# Patient Record
Sex: Female | Born: 1935 | Race: White | Hispanic: No | Smoking: Never smoker
Health system: Southern US, Community
[De-identification: ages and names within clinical notes are randomized; demographics above are authoritative.]

## PROBLEM LIST (undated history)

## (undated) DIAGNOSIS — D649 Anemia, unspecified: Secondary | ICD-10-CM

## (undated) DIAGNOSIS — N183 Chronic kidney disease, stage 3 unspecified: Secondary | ICD-10-CM

## (undated) DIAGNOSIS — K5790 Diverticulosis of intestine, part unspecified, without perforation or abscess without bleeding: Secondary | ICD-10-CM

## (undated) DIAGNOSIS — M1990 Unspecified osteoarthritis, unspecified site: Secondary | ICD-10-CM

## (undated) DIAGNOSIS — R600 Localized edema: Secondary | ICD-10-CM

## (undated) DIAGNOSIS — E785 Hyperlipidemia, unspecified: Secondary | ICD-10-CM

## (undated) DIAGNOSIS — IMO0001 Reserved for inherently not codable concepts without codable children: Secondary | ICD-10-CM

## (undated) DIAGNOSIS — D126 Benign neoplasm of colon, unspecified: Secondary | ICD-10-CM

## (undated) DIAGNOSIS — N6320 Unspecified lump in the left breast, unspecified quadrant: Secondary | ICD-10-CM

## (undated) DIAGNOSIS — I471 Supraventricular tachycardia, unspecified: Secondary | ICD-10-CM

## (undated) DIAGNOSIS — I700 Atherosclerosis of aorta: Secondary | ICD-10-CM

## (undated) DIAGNOSIS — H01009 Unspecified blepharitis unspecified eye, unspecified eyelid: Secondary | ICD-10-CM

## (undated) DIAGNOSIS — R002 Palpitations: Secondary | ICD-10-CM

## (undated) DIAGNOSIS — E669 Obesity, unspecified: Secondary | ICD-10-CM

## (undated) DIAGNOSIS — E7800 Pure hypercholesterolemia, unspecified: Secondary | ICD-10-CM

## (undated) DIAGNOSIS — H547 Unspecified visual loss: Secondary | ICD-10-CM

## (undated) DIAGNOSIS — R7303 Prediabetes: Secondary | ICD-10-CM

## (undated) DIAGNOSIS — M1711 Unilateral primary osteoarthritis, right knee: Secondary | ICD-10-CM

## (undated) DIAGNOSIS — I10 Essential (primary) hypertension: Secondary | ICD-10-CM

## (undated) HISTORY — DX: Unilateral primary osteoarthritis, right knee: M17.11

## (undated) HISTORY — DX: Diverticulosis of intestine, part unspecified, without perforation or abscess without bleeding: K57.90

## (undated) HISTORY — DX: Atherosclerosis of aorta: I70.0

## (undated) HISTORY — DX: Benign neoplasm of colon, unspecified: D12.6

## (undated) HISTORY — DX: Unspecified lump in the left breast, unspecified quadrant: N63.20

## (undated) HISTORY — PX: ROTATOR CUFF REPAIR: SHX139

## (undated) HISTORY — PX: CHOLECYSTECTOMY: SHX55

## (undated) HISTORY — DX: Unspecified blepharitis unspecified eye, unspecified eyelid: H01.009

## (undated) HISTORY — DX: Anemia, unspecified: D64.9

## (undated) HISTORY — DX: Unspecified visual loss: H54.7

## (undated) HISTORY — DX: Pure hypercholesterolemia, unspecified: E78.00

## (undated) HISTORY — DX: Reserved for inherently not codable concepts without codable children: IMO0001

## (undated) HISTORY — DX: Supraventricular tachycardia: I47.1

## (undated) HISTORY — DX: Hyperlipidemia, unspecified: E78.5

## (undated) HISTORY — PX: ABDOMINAL HYSTERECTOMY: SHX81

## (undated) HISTORY — DX: Chronic kidney disease, stage 3 (moderate): N18.3

## (undated) HISTORY — DX: Unspecified osteoarthritis, unspecified site: M19.90

## (undated) HISTORY — DX: Essential (primary) hypertension: I10

## (undated) HISTORY — DX: Chronic kidney disease, stage 3 unspecified: N18.30

## (undated) HISTORY — DX: Localized edema: R60.0

## (undated) HISTORY — DX: Palpitations: R00.2

## (undated) HISTORY — DX: Supraventricular tachycardia, unspecified: I47.10

## (undated) HISTORY — DX: Obesity, unspecified: E66.9

## (undated) HISTORY — DX: Prediabetes: R73.03

---
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
## 1997-09-06 ENCOUNTER — Ambulatory Visit (HOSPITAL_COMMUNITY): Admission: RE | Admit: 1997-09-06 | Discharge: 1997-09-06 | Payer: Self-pay | Admitting: Surgery

## 1999-04-29 HISTORY — PX: ROTATOR CUFF REPAIR: SHX139

## 1999-10-03 ENCOUNTER — Encounter: Admission: RE | Admit: 1999-10-03 | Discharge: 1999-10-03 | Payer: Self-pay | Admitting: Family Medicine

## 1999-10-03 ENCOUNTER — Encounter: Payer: Self-pay | Admitting: Family Medicine

## 1999-12-19 ENCOUNTER — Encounter: Payer: Self-pay | Admitting: Orthopedic Surgery

## 1999-12-19 ENCOUNTER — Encounter: Admission: RE | Admit: 1999-12-19 | Discharge: 1999-12-19 | Payer: Self-pay | Admitting: Orthopedic Surgery

## 1999-12-20 ENCOUNTER — Ambulatory Visit (HOSPITAL_BASED_OUTPATIENT_CLINIC_OR_DEPARTMENT_OTHER): Admission: RE | Admit: 1999-12-20 | Discharge: 1999-12-21 | Payer: Self-pay | Admitting: Orthopedic Surgery

## 2000-02-22 ENCOUNTER — Emergency Department (HOSPITAL_COMMUNITY): Admission: EM | Admit: 2000-02-22 | Discharge: 2000-02-23 | Payer: Self-pay | Admitting: Emergency Medicine

## 2000-02-22 ENCOUNTER — Encounter: Payer: Self-pay | Admitting: Emergency Medicine

## 2000-02-23 ENCOUNTER — Encounter: Payer: Self-pay | Admitting: Emergency Medicine

## 2000-03-10 ENCOUNTER — Encounter (INDEPENDENT_AMBULATORY_CARE_PROVIDER_SITE_OTHER): Payer: Self-pay

## 2000-03-10 ENCOUNTER — Observation Stay (HOSPITAL_COMMUNITY): Admission: RE | Admit: 2000-03-10 | Discharge: 2000-03-11 | Payer: Self-pay

## 2000-04-28 HISTORY — PX: OTHER SURGICAL HISTORY: SHX169

## 2000-04-28 HISTORY — PX: CATARACT EXTRACTION: SUR2

## 2000-10-01 ENCOUNTER — Encounter: Payer: Self-pay | Admitting: Family Medicine

## 2000-10-01 ENCOUNTER — Encounter: Admission: RE | Admit: 2000-10-01 | Discharge: 2000-10-01 | Payer: Self-pay | Admitting: Family Medicine

## 2000-10-20 ENCOUNTER — Encounter: Admission: RE | Admit: 2000-10-20 | Discharge: 2000-10-20 | Payer: Self-pay | Admitting: Family Medicine

## 2000-10-20 ENCOUNTER — Encounter: Payer: Self-pay | Admitting: Family Medicine

## 2000-10-22 ENCOUNTER — Encounter: Admission: RE | Admit: 2000-10-22 | Discharge: 2000-10-22 | Payer: Self-pay | Admitting: Family Medicine

## 2000-10-22 ENCOUNTER — Encounter: Payer: Self-pay | Admitting: Family Medicine

## 2001-03-29 ENCOUNTER — Encounter: Payer: Self-pay | Admitting: Orthopedic Surgery

## 2001-04-02 ENCOUNTER — Inpatient Hospital Stay (HOSPITAL_COMMUNITY): Admission: RE | Admit: 2001-04-02 | Discharge: 2001-04-07 | Payer: Self-pay | Admitting: Orthopedic Surgery

## 2001-10-01 ENCOUNTER — Other Ambulatory Visit: Admission: RE | Admit: 2001-10-01 | Discharge: 2001-10-01 | Payer: Self-pay | Admitting: Family Medicine

## 2001-10-25 ENCOUNTER — Encounter: Admission: RE | Admit: 2001-10-25 | Discharge: 2001-10-25 | Payer: Self-pay | Admitting: Family Medicine

## 2001-10-25 ENCOUNTER — Encounter: Payer: Self-pay | Admitting: Family Medicine

## 2002-11-04 ENCOUNTER — Encounter: Payer: Self-pay | Admitting: Family Medicine

## 2002-11-04 ENCOUNTER — Encounter: Admission: RE | Admit: 2002-11-04 | Discharge: 2002-11-04 | Payer: Self-pay | Admitting: Family Medicine

## 2002-11-10 ENCOUNTER — Encounter: Admission: RE | Admit: 2002-11-10 | Discharge: 2002-11-10 | Payer: Self-pay | Admitting: Family Medicine

## 2002-11-10 ENCOUNTER — Encounter: Payer: Self-pay | Admitting: Family Medicine

## 2003-10-01 ENCOUNTER — Emergency Department (HOSPITAL_COMMUNITY): Admission: EM | Admit: 2003-10-01 | Discharge: 2003-10-01 | Payer: Self-pay | Admitting: Emergency Medicine

## 2007-04-29 DIAGNOSIS — N6320 Unspecified lump in the left breast, unspecified quadrant: Secondary | ICD-10-CM

## 2007-04-29 HISTORY — DX: Unspecified lump in the left breast, unspecified quadrant: N63.20

## 2008-04-03 ENCOUNTER — Encounter: Admission: RE | Admit: 2008-04-03 | Discharge: 2008-04-12 | Payer: Self-pay | Admitting: Family Medicine

## 2009-12-27 DIAGNOSIS — R7303 Prediabetes: Secondary | ICD-10-CM

## 2009-12-27 HISTORY — DX: Prediabetes: R73.03

## 2010-01-26 ENCOUNTER — Emergency Department (HOSPITAL_COMMUNITY): Admission: EM | Admit: 2010-01-26 | Discharge: 2010-01-27 | Payer: Self-pay | Admitting: Emergency Medicine

## 2010-01-29 ENCOUNTER — Ambulatory Visit: Payer: Self-pay | Admitting: Cardiology

## 2010-06-04 ENCOUNTER — Emergency Department (HOSPITAL_COMMUNITY)
Admission: EM | Admit: 2010-06-04 | Discharge: 2010-06-05 | Disposition: A | Discharge status: Home / Self Care | Payer: Medicare Other | Attending: Emergency Medicine | Admitting: Emergency Medicine

## 2010-06-04 DIAGNOSIS — R002 Palpitations: Secondary | ICD-10-CM | POA: Insufficient documentation

## 2010-06-04 DIAGNOSIS — R000 Tachycardia, unspecified: Secondary | ICD-10-CM | POA: Insufficient documentation

## 2010-06-05 ENCOUNTER — Ambulatory Visit (INDEPENDENT_AMBULATORY_CARE_PROVIDER_SITE_OTHER): Payer: BC Managed Care – PPO | Admitting: *Deleted

## 2010-06-05 DIAGNOSIS — I471 Supraventricular tachycardia: Secondary | ICD-10-CM

## 2010-06-05 DIAGNOSIS — I10 Essential (primary) hypertension: Secondary | ICD-10-CM

## 2010-06-05 DIAGNOSIS — R002 Palpitations: Secondary | ICD-10-CM

## 2010-06-10 ENCOUNTER — Ambulatory Visit (HOSPITAL_COMMUNITY): Payer: Medicare Other | Attending: Cardiology

## 2010-06-10 ENCOUNTER — Encounter: Payer: Self-pay | Admitting: Cardiology

## 2010-06-10 DIAGNOSIS — I10 Essential (primary) hypertension: Secondary | ICD-10-CM | POA: Insufficient documentation

## 2010-06-10 DIAGNOSIS — E669 Obesity, unspecified: Secondary | ICD-10-CM | POA: Insufficient documentation

## 2010-06-10 DIAGNOSIS — E785 Hyperlipidemia, unspecified: Secondary | ICD-10-CM | POA: Insufficient documentation

## 2010-06-10 DIAGNOSIS — I379 Nonrheumatic pulmonary valve disorder, unspecified: Secondary | ICD-10-CM | POA: Insufficient documentation

## 2010-06-10 DIAGNOSIS — I080 Rheumatic disorders of both mitral and aortic valves: Secondary | ICD-10-CM | POA: Insufficient documentation

## 2010-06-10 DIAGNOSIS — R002 Palpitations: Secondary | ICD-10-CM | POA: Insufficient documentation

## 2010-06-10 DIAGNOSIS — I471 Supraventricular tachycardia: Secondary | ICD-10-CM

## 2010-06-11 ENCOUNTER — Encounter: Payer: Self-pay | Admitting: Cardiology

## 2010-07-10 LAB — URINALYSIS, ROUTINE W REFLEX MICROSCOPIC
Bilirubin Urine: NEGATIVE
Glucose, UA: NEGATIVE mg/dL
Hgb urine dipstick: NEGATIVE
Ketones, ur: NEGATIVE mg/dL
Nitrite: NEGATIVE
Protein, ur: NEGATIVE mg/dL
Specific Gravity, Urine: 1.003 — ABNORMAL LOW (ref 1.005–1.030)
Urobilinogen, UA: 0.2 mg/dL (ref 0.0–1.0)
pH: 6.5 (ref 5.0–8.0)

## 2010-07-10 LAB — CBC
RBC: 3.79 MIL/uL — ABNORMAL LOW (ref 3.87–5.11)
WBC: 5.4 10*3/uL (ref 4.0–10.5)

## 2010-07-10 LAB — DIFFERENTIAL
Basophils Absolute: 0 10*3/uL (ref 0.0–0.1)
Basophils Relative: 0 % (ref 0–1)
Eosinophils Absolute: 0 10*3/uL (ref 0.0–0.7)
Monocytes Relative: 9 % (ref 3–12)
Neutro Abs: 3.1 10*3/uL (ref 1.7–7.7)
Neutrophils Relative %: 58 % (ref 43–77)

## 2010-07-10 LAB — BASIC METABOLIC PANEL
BUN: 25 mg/dL — ABNORMAL HIGH (ref 6–23)
CO2: 26 mEq/L (ref 19–32)
Calcium: 8.9 mg/dL (ref 8.4–10.5)
Chloride: 108 mEq/L (ref 96–112)
Creatinine, Ser: 1.08 mg/dL (ref 0.4–1.2)
GFR calc Af Amer: 58 mL/min — ABNORMAL LOW (ref >60)
GFR calc non Af Amer: 48 mL/min — ABNORMAL LOW (ref >60)
Glucose, Bld: 106 mg/dL — ABNORMAL HIGH (ref 70–99)
Potassium: 3.5 mEq/L (ref 3.5–5.1)
Sodium: 141 mEq/L (ref 135–145)

## 2010-09-13 NOTE — Op Note (Signed)
[place]  Patient:    Tara Price, Tara Price Visit Number: [id number] MRN: [id number]          Service Type: SUR Location: [id number] Attending Physician:  Milly Jakob Dictated by:   Harvie Junior, M.D. Proc. Date: 04/02/01 Admit Date:  04/02/2001   CC:         Chales Salmon. Abigail Miyamoto, M.D.   Operative Report  PREOPERATIVE DIAGNOSIS:  Degenerative joint disease of bilateral knees.  POSTOPERATIVE DIAGNOSIS:  Degenerative joint disease of bilateral knees.  PROCEDURE:  Left total knee replacement.  SURGEON:  Harvie Junior, M.D.  ASSISTANT:  Currie Paris. Thedore Mins.  ANESTHESIA:  General.  BRIEF HISTORY:  She is a 77 year old female with a long history of having degenerative joint disease of bilateral knees.  She ultimately has had chronic night pain.  She has failed all conservative care and after failure of conservative care, she was ultimately brought to the operating room for a total knee replacement.  DESCRIPTION OF PROCEDURE:  Patient brought to the operating room and after adequate anesthesia was obtained with a general anesthetic, the patient was placed supine upon the operating table.  The left leg was then prepped and draped in the usual sterile fashion.  Following Esmarch exsanguination of the leg, a blood pressure tourniquet was inflated to 350 mmHg.  Following this a midline incision was made in the subcutaneous tissue, taken down to the level of the extensor mechanism.  A medial parapatellar arthrotomy was undertaken, and the menisci, cruciates, and fat pad were excised.  The knee was then flexed up and the guide rod was put down the central portion of the tibia. The alignment was then checked and a cut was made perpendicular to the long axis of the tibia.  Once this was cut, attention was turned to the femur, where it was sized to a standard plus.  The standard plus jig was then used with a straight yoke.  The anterior and  posterior cuts were made, and the lollipop guide was put in place in the flexion gap.  The flexion gap was measured at 15.  At this point attention was turned toward the central alignment guide, where a 4 degree valgus alignment guide was put in place and the distal cuts were made to match an extension gap of 15 with the lollipop. This went in a little bit snug, but it felt that taking more bone was not necessary and that she may just be a little bit snug in extension, and attention at this point was turned toward the chamfer cuts, which were undertaken.  Once the chamfer cuts were undertaken, the posterior condyles were then resected.  Attention was then turned toward the tibia, where a size 3, she was sort of a "tweener" between a 2-1/2 and a 3.  Given her size, it was felt that a 3 was going to be most appropriate, and a 3 was chosen and the final preparation of the tibia was undertaken at this point.  Once this was undertaken, the trials were put in place and the patient really just seemed a little tight in extension with the 15 mm.  There was no midflexion instability or problems of this nature.  It was felt at that point that a trial of the 12.5 would be better and when the 12.5 trial was put in place, it did, in fact, feel better.  The patella was then sized and cut and a 35  mm patella was put in place and all trials were put in place.  The range of motion was excellent.  There was no tendency toward spin-out of the poly.  It just felt better in full extension with the 12.5, and the flexion, midflexion stability, and flexion gap seemed reasonable.  At this point the attention was turned toward the removal of all the components.  The knee was then copiously irrigated.  All bony _____ were irrigated and cleansed and then dried, and the final components were cemented into place.  Excellent stability and range of motion were achieved when the components were cemented into place,  and there was no tendency toward patellar maltracking or component malrotation or component rotation.  At this point the knee was then copiously irrigated and suctioned dry.  A medium Hemovac drain was put in place.  A #1 running Vicryl suture was used to close the capsule.  The subcu was closed with running 2-0 Vicryl and the skin with skin staples.  A sterile and compressive dressing applied.  The patient was taken to the recovery room, where she was noted to be in satisfactory condition.  The estimated blood loss for the procedure was _____. Dictated by:   Harvie Junior, M.D. Attending Physician:  Milly Jakob DD:  04/02/01 TD:  04/03/01 Job: [id number]

## 2010-09-13 NOTE — Discharge Summary (Signed)
. [facility]  Patient:    Tara Price, Tara Price Visit Number: [id number] MRN: [id number]          Service Type: SUR Location: [id number] Attending Physician:  Milly Jakob Dictated by:   Currie Paris Thedore Mins. Admit Date:  04/02/2001 Discharge Date: 04/07/2001   CC:         Chales Salmon. Abigail Miyamoto, M.D.   Discharge Summary  ADMITTING DIAGNOSES: 1. End-stage degenerative joint disease, left knee. 2. Hypertension. 3. Hypercholesterolemia.  DISCHARGE DIAGNOSES: 1. End-stage degenerative joint disease, left knee. 2. Hypertension. 3. Hypercholesterolemia.  PROCEDURES IN HOSPITAL:  Left total knee arthroplasty, Milly Jakob, M.D., April 02, 2001.  CONSULTATIONS IN HOSPITAL: 1. Physical medicine. 2. Rehabilitation.  BRIEF HISTORY:  Tara Price is a pleasant 77 year old patient whom we have followed for some time with a long history of left knee pain.  The standing x-rays of the left knee showed bone-on-bone arthritis.  She got only temporary relief with conservative treatment including cortisone injections, use of medications and modification of her activities.  She had pain with every step and night pain; and, based on her clinical and radiographic findings was felt to be a candidate for a left total knee arthroplasty.  She is admitted for this.  PERTINENT LABORATORY STUDIES:  EKG on admission showed normal sinus rhythm with nonspecific T wave abnormality.  Hemoglobin on admission was 12.4, hematocrit 36.6 and WBC 5.7.  On postop day number one her hemoglobin was 9.0.  On postop day number two it was 8.3.   On postop day number it was 8.1 with a hematocrit of 23.8.  Pro time on admission was 12.9 seconds with an INR of 1.0 and PTT was 28.  On the date of discharge her pro time was 26.8 seconds with an INR of 3.3 on Coumadin therapy.  CMET on admission was within normal limits and other than slightly elevated glucose postoperatively at  138 to 123, her BMET was within normal limits.  Urinalysis showed no abnormalities.  HOSPITAL COURSE:  Patient underwent a left total knee arthroplasty, as well described in Dr. Luiz Blare operative note on April 02, 2001.  Postoperatively she was placed on Ancef 1 gram IV q.8h. times six doses.  She was put on Coumadin antithrombolytic therapy per pharmacy protocol.  PCA morphine pump was used for pain control.  Physical therapy was ordered for walker ambulation, weightbearing as tolerated on the left.  CPM machine was also used for range of motion.  On postop day number one she was doing well.  Pain was controlled nicely with a PCA morphine pump.  Her vital signs were stable.  She was afebrile.  Her INR was 1.2.  On postop day number two knee dressing was changed.  Her Hemovac drain was pulled.  Hemoglobin was 8.9.  On postop day number three she was without complaints.  She was taking fluids and voiding without difficulty.  Her Foley had been discontinued on postop day number two.  Her hemoglobin was 8.3, potassium 4.0, glucose 123 and INR 2.2. Her IV and PCA morphine pump were discontinued.  She was continued on PT and OT.  We checked her hemoglobin the following day and it was 8.1 with hematocrit of 23.8.  We felt that we could continue her on oral iron therapy for this.  Rehab consult was obtained.  The patient was doing well and wished to go home with Home Health PT.  On postop day number five, April 07, 2001, the patient was without complaints.  She had ambulated in the hall.  She was taking fluids and voiding without difficulty.  Her vital signs were stable and she was afebrile.  Her left knee wound was benign.  Her calf was soft, no sign of DVT.  Her INR was slightly elevated at 3.3.  DISPOSITION AND CONDITION ON DISCHARGE:  She was discharged home in improved condition.  DIET:  Was on a regular diet.  DISCHARGE MEDICATIONS:  She was given Rx for Tylox p.r.n. for  pain and Coumadin per pharmacy protocol times one month postop.  FOLLOW-UP:  She will follow up with Dr. Luiz Blare in 10 days.  SPECIAL INSTRUCTIONS:  She will need a home CPM, home physical therapy and home R.N. for pro time blood draws as indicated. Dictated by:   Currie Paris Thedore Mins. Attending Physician:  Milly Jakob DD:  05/12/01 TD:  05/12/01 Job: [phone] [id number]

## 2011-01-03 ENCOUNTER — Encounter: Payer: Self-pay | Admitting: Internal Medicine

## 2011-01-06 ENCOUNTER — Encounter: Payer: Self-pay | Admitting: Internal Medicine

## 2011-01-06 ENCOUNTER — Ambulatory Visit (INDEPENDENT_AMBULATORY_CARE_PROVIDER_SITE_OTHER): Payer: Medicare Other | Admitting: Internal Medicine

## 2011-01-06 DIAGNOSIS — I10 Essential (primary) hypertension: Secondary | ICD-10-CM | POA: Insufficient documentation

## 2011-01-06 DIAGNOSIS — E785 Hyperlipidemia, unspecified: Secondary | ICD-10-CM | POA: Insufficient documentation

## 2011-01-06 LAB — HEPATIC FUNCTION PANEL
ALT: 16 U/L (ref 0–35)
AST: 24 U/L (ref 0–37)
Albumin: 3.7 g/dL (ref 3.5–5.2)
Alkaline Phosphatase: 51 U/L (ref 39–117)
Total Bilirubin: 0.8 mg/dL (ref 0.3–1.2)

## 2011-01-06 LAB — BASIC METABOLIC PANEL
CO2: 27 mEq/L (ref 19–32)
Calcium: 9.2 mg/dL (ref 8.4–10.5)
Creatinine, Ser: 1.2 mg/dL (ref 0.4–1.2)
GFR: 44.09 mL/min — ABNORMAL LOW (ref >60.00)
Glucose, Bld: 103 mg/dL — ABNORMAL HIGH (ref 70–99)
Sodium: 142 mEq/L (ref 135–145)

## 2011-01-06 LAB — LIPID PANEL
HDL: 66.3 mg/dL (ref >39.00)
Total CHOL/HDL Ratio: 2
Triglycerides: 80 mg/dL (ref 0.0–149.0)

## 2011-01-06 NOTE — Assessment & Plan Note (Signed)
Stable No change required today  Check fasting lipids and LFTs today

## 2011-01-06 NOTE — Assessment & Plan Note (Signed)
Stable No change required today BMET today

## 2011-01-06 NOTE — Patient Instructions (Signed)
Your physician wants you to follow-up in: 12 months with Dr Jacquiline Doe will receive a reminder letter in the mail two months in advance. If you don't receive a letter, please call our office to schedule the follow-up appointment.   Your physician recommends that you return for lab work today--lipid/liver/bmp

## 2011-01-06 NOTE — Progress Notes (Signed)
Tara Price is a pleasant 77 y.o. WF patient with a h/o HTN and HL previously followed by Dr Deborah Chalk  who presents today to establish care in my cardiology clinic.   The patient reports doing very well since her last visit and remains very active despite her age.  Her primary limitation is with bilateral knee pain.   Today, she  denies symptoms of palpitations, chest pain, shortness of breath, orthopnea, PND, lower extremity edema, dizziness, presyncope, syncope, or neurologic sequela.  The patientis tolerating medications without difficulties and is otherwise without complaint today.   Past Medical History  Diagnosis Date  . Hypertension   . Hyperlipidemia   . Palpitations   . DJD (degenerative joint disease)   . Obesity     History   Social History  . Marital Status: Married    Spouse Name: N/A    Number of Children: N/A  . Years of Education: N/A   Occupational History  . Not on file.   Social History Main Topics  . Smoking status: Never Smoker   . Smokeless tobacco: Not on file  . Alcohol Use: Yes     rare  . Drug Use: No  . Sexually Active: Not on file   Other Topics Concern  . 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    She is unaware of significant FH  No Known Allergies  Current Outpatient Prescriptions  Medication Sig Dispense Refill  . aspirin 325 MG tablet Take 325 mg by mouth daily.        Marland Kitchen atorvastatin (LIPITOR) 10 MG tablet Take 1 tablet by mouth Daily.      Marland Kitchen DIOVAN HCT 80-12.5 MG per tablet Take 1 tablet by mouth Daily.      . metoprolol (TOPROL-XL) 100 MG 24 hr tablet Take 1 tablet by mouth Daily.      . traMADol (ULTRAM) 50 MG tablet Take 50 mg by mouth every 6 (six) hours as needed.          ROS- all systems are reviewed and negative except as per HPI  Physical Exam: Filed Vitals:   01/06/11 1022  BP: 125/67  Pulse: 58    Height: 5\' 3"  (1.6 m)  Weight: 188 lb (85.276 kg)    GEN- The patient is overweight but well appearing, alert and oriented x 3 today.   Head- normocephalic, atraumatic Eyes-  Sclera clear, conjunctiva pink Ears- hearing intact Oropharynx- clear Neck- supple, no JVP Lymph- no cervical lymphadenopathy Lungs- Clear to ausculation bilaterally, normal work of breathing Heart- Regular rate and rhythm, no murmurs, rubs or gallops, PMI not laterally displaced GI- soft, NT, ND, + BS Extremities- no clubbing, cyanosis, or edema MS- no significant deformity or atrophy Skin- no rash or lesion Psych- euthymic mood, full affect Neuro- strength and sensation are intact  Assessment and Plan:

## 2011-01-07 ENCOUNTER — Encounter: Payer: Self-pay | Admitting: Internal Medicine

## 2011-01-07 NOTE — Progress Notes (Signed)
Addended by: Burnett Kanaris A on: 01/07/2011 11:52 AM   Modules accepted: Orders

## 2011-01-17 ENCOUNTER — Encounter: Payer: Self-pay | Admitting: *Deleted

## 2013-07-07 ENCOUNTER — Ambulatory Visit (INDEPENDENT_AMBULATORY_CARE_PROVIDER_SITE_OTHER): Payer: Medicare Other | Admitting: Internal Medicine

## 2013-07-07 ENCOUNTER — Encounter: Payer: Self-pay | Admitting: Internal Medicine

## 2013-07-07 VITALS — BP 124/56 | HR 56 | Ht 62.0 in | Wt 176.0 lb

## 2013-07-07 DIAGNOSIS — I10 Essential (primary) hypertension: Secondary | ICD-10-CM

## 2013-07-07 DIAGNOSIS — E785 Hyperlipidemia, unspecified: Secondary | ICD-10-CM

## 2013-07-07 DIAGNOSIS — E7800 Pure hypercholesterolemia, unspecified: Secondary | ICD-10-CM

## 2013-07-07 LAB — BASIC METABOLIC PANEL
BUN: 29 mg/dL — ABNORMAL HIGH (ref 6–23)
CHLORIDE: 106 meq/L (ref 96–112)
CO2: 28 mEq/L (ref 19–32)
Calcium: 9.9 mg/dL (ref 8.4–10.5)
Creatinine, Ser: 1.2 mg/dL (ref 0.4–1.2)
GFR: 45.1 mL/min — ABNORMAL LOW (ref >60.00)
Glucose, Bld: 100 mg/dL — ABNORMAL HIGH (ref 70–99)
POTASSIUM: 4.6 meq/L (ref 3.5–5.1)
SODIUM: 142 meq/L (ref 135–145)

## 2013-07-07 LAB — HEPATIC FUNCTION PANEL
ALBUMIN: 3.9 g/dL (ref 3.5–5.2)
ALT: 22 U/L (ref 0–35)
AST: 29 U/L (ref 0–37)
Alkaline Phosphatase: 60 U/L (ref 39–117)
BILIRUBIN DIRECT: 0.1 mg/dL (ref 0.0–0.3)
TOTAL PROTEIN: 7 g/dL (ref 6.0–8.3)
Total Bilirubin: 0.7 mg/dL (ref 0.3–1.2)

## 2013-07-07 LAB — LIPID PANEL
CHOLESTEROL: 150 mg/dL (ref 0–200)
HDL: 70 mg/dL (ref >39.00)
LDL CALC: 65 mg/dL (ref 0–99)
TRIGLYCERIDES: 75 mg/dL (ref 0.0–149.0)
Total CHOL/HDL Ratio: 2
VLDL: 15 mg/dL (ref 0.0–40.0)

## 2013-07-07 NOTE — Progress Notes (Signed)
   PCP:  Tawanna Solo, MD  The patient presents today for routine cardiology followup. She is seen today for hypertension and hyperlipidemia.  She says that she occasionally has palpitations that she takes with a prn medication at home but she is unsure of name.  She has occasional postural dizziness.  Since last being seen in our clinic, the patient reports doing very well.  Today, she denies symptoms of  chest pain, shortness of breath, orthopnea, PND, lower extremity edema, presyncope, syncope, or neurologic sequela.  The patient feels that she is tolerating medications without difficulties and is otherwise without complaint today.   Past Medical History  Diagnosis Date  . Hypertension   . Hyperlipidemia   . Palpitations   . DJD (degenerative joint disease)   . Obesity    No past surgical history on file.  Current Outpatient Prescriptions  Medication Sig Dispense Refill  . aspirin 325 MG tablet Take 325 mg by mouth daily.        Marland Kitchen atorvastatin (LIPITOR) 10 MG tablet Take 1 tablet by mouth Daily.      . hydrochlorothiazide (MICROZIDE) 12.5 MG capsule daily.      Marland Kitchen losartan (COZAAR) 50 MG tablet daily.      . metoprolol (TOPROL-XL) 100 MG 24 hr tablet Take 1 tablet by mouth Daily.       No current facility-administered medications for this visit.    No Known Allergies  History   Social History  . Marital Status: Married    Spouse Name: N/A    Number of Children: N/A  . Years of Education: N/A   Occupational History  . Not on file.   Social History Main Topics  . Smoking status: Never Smoker   . Smokeless tobacco: Not on file  . Alcohol Use: Yes     Comment: rare  . Drug Use: No  . Sexual Activity: Not on file   Other Topics Concern  . Not on file   Social History Narrative   Lives in [place] with spouse who has cancer.   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    Physical Exam: Filed Vitals:   07/07/13 1513  BP: 124/56    Pulse: 56  Height: 5\' 2"  (1.575 m)  Weight: 176 lb (79.833 kg)    GEN- The patient is elderly appearing, alert and oriented x 3 today.   Head- normocephalic, atraumatic Eyes-  Sclera clear, conjunctiva pink Ears- hearing intact Oropharynx- clear Neck- supple, no JVP Lymph- no cervical lymphadenopathy Lungs- Clear to ausculation bilaterally, normal work of breathing Heart- Regular rate and rhythm, no murmurs, rubs or gallops, PMI not laterally displaced GI- soft, NT, ND, + BS Extremities- no clubbing, cyanosis, or edema Neuro- strength and sensation are intact  EKG today reveals sinus rhythm 56 bm, incomplete RBBB  Assessment and Plan:  1. htn Stable bmet today No change required today  2. Hl Lipids and lFTs today  Return to see Cecille Rubin in 1 year I will see in 2 years

## 2013-07-07 NOTE — Patient Instructions (Addendum)
Your physician wants you to follow-up in: 12 months with Tara Price You will receive a reminder letter in the mail two months in advance. If you don't receive a letter, please call our office to schedule the follow-up appointment.   Your physician recommends that you return for lab work today: BMP/LIPID/LIVER

## 2014-03-28 DIAGNOSIS — D649 Anemia, unspecified: Secondary | ICD-10-CM

## 2014-03-28 HISTORY — DX: Anemia, unspecified: D64.9

## 2015-11-12 DIAGNOSIS — N183 Chronic kidney disease, stage 3 (moderate): Secondary | ICD-10-CM | POA: Diagnosis not present

## 2015-11-12 DIAGNOSIS — D649 Anemia, unspecified: Secondary | ICD-10-CM | POA: Diagnosis not present

## 2015-11-12 DIAGNOSIS — I10 Essential (primary) hypertension: Secondary | ICD-10-CM | POA: Diagnosis not present

## 2015-11-12 DIAGNOSIS — E7800 Pure hypercholesterolemia, unspecified: Secondary | ICD-10-CM | POA: Diagnosis not present

## 2016-04-09 DIAGNOSIS — Z23 Encounter for immunization: Secondary | ICD-10-CM | POA: Diagnosis not present

## 2016-04-09 DIAGNOSIS — Z0000 Encounter for general adult medical examination without abnormal findings: Secondary | ICD-10-CM | POA: Diagnosis not present

## 2016-04-09 DIAGNOSIS — D649 Anemia, unspecified: Secondary | ICD-10-CM | POA: Diagnosis not present

## 2016-04-09 DIAGNOSIS — I471 Supraventricular tachycardia: Secondary | ICD-10-CM | POA: Diagnosis not present

## 2016-04-09 DIAGNOSIS — I10 Essential (primary) hypertension: Secondary | ICD-10-CM | POA: Diagnosis not present

## 2016-04-09 DIAGNOSIS — N183 Chronic kidney disease, stage 3 (moderate): Secondary | ICD-10-CM | POA: Diagnosis not present

## 2016-10-09 DIAGNOSIS — I471 Supraventricular tachycardia: Secondary | ICD-10-CM | POA: Diagnosis not present

## 2016-10-09 DIAGNOSIS — E7800 Pure hypercholesterolemia, unspecified: Secondary | ICD-10-CM | POA: Diagnosis not present

## 2016-10-09 DIAGNOSIS — I10 Essential (primary) hypertension: Secondary | ICD-10-CM | POA: Diagnosis not present

## 2016-10-09 DIAGNOSIS — N183 Chronic kidney disease, stage 3 (moderate): Secondary | ICD-10-CM | POA: Diagnosis not present

## 2016-10-27 DIAGNOSIS — J069 Acute upper respiratory infection, unspecified: Secondary | ICD-10-CM | POA: Diagnosis not present

## 2016-11-17 DIAGNOSIS — H524 Presbyopia: Secondary | ICD-10-CM | POA: Diagnosis not present

## 2016-11-21 DIAGNOSIS — J040 Acute laryngitis: Secondary | ICD-10-CM | POA: Diagnosis not present

## 2017-03-02 DIAGNOSIS — I10 Essential (primary) hypertension: Secondary | ICD-10-CM | POA: Diagnosis not present

## 2017-03-02 DIAGNOSIS — M1711 Unilateral primary osteoarthritis, right knee: Secondary | ICD-10-CM | POA: Diagnosis not present

## 2017-03-02 DIAGNOSIS — Z23 Encounter for immunization: Secondary | ICD-10-CM | POA: Diagnosis not present

## 2017-03-02 DIAGNOSIS — R600 Localized edema: Secondary | ICD-10-CM | POA: Diagnosis not present

## 2017-03-09 ENCOUNTER — Ambulatory Visit (INDEPENDENT_AMBULATORY_CARE_PROVIDER_SITE_OTHER): Payer: Medicare Other | Admitting: Orthopedic Surgery

## 2017-03-09 ENCOUNTER — Encounter (INDEPENDENT_AMBULATORY_CARE_PROVIDER_SITE_OTHER): Payer: Self-pay | Admitting: Orthopedic Surgery

## 2017-03-09 DIAGNOSIS — M1711 Unilateral primary osteoarthritis, right knee: Secondary | ICD-10-CM | POA: Diagnosis not present

## 2017-03-09 MED ORDER — LIDOCAINE HCL 1 % IJ SOLN
5.0000 mL | INTRAMUSCULAR | Status: AC | PRN
  Administered 2017-03-09: 5 mL

## 2017-03-09 MED ORDER — PREDNISONE 10 MG PO TABS: 10.0000 mg | ORAL_TABLET | Freq: Every day | ORAL | 3 refills | Status: AC

## 2017-03-09 MED ORDER — METHYLPREDNISOLONE ACETATE 40 MG/ML IJ SUSP
40.0000 mg | INTRAMUSCULAR | Status: AC | PRN
  Administered 2017-03-09: 40 mg via INTRA_ARTICULAR

## 2017-03-09 NOTE — Progress Notes (Signed)
Office Visit Note   Patient: Tara Price           Date of Birth: 1923/05/10           MRN: [id number] Visit Date: 03/09/2017              Requested by: Kathyrn Lass, [place], [place] [id number] PCP: Kathyrn Lass, MD  Chief Complaint  Patient presents with  . Right Knee - Follow-up      HPI: Is a 83 year old woman who was seen for evaluation for chronic osteoarthritis right knee.  She states that it got worse this past week and could not even walk.  She states she has had symptoms on and off for years.  She states she has taken prednisone in the past with good relief.  She is status post a left total knee arthroplasty.  She currently ambulates with a cane.  Assessment & Plan: Visit Diagnoses:  1. Unilateral primary osteoarthritis, right knee     Plan: Right knee was injected with steroid.  Discussed that we could proceed with injections every 3 months.  She was sent in a prescription for 10 mg of prednisone she will take this when the injection wears off and will wean off the medication as she feels comfortable.  Follow-Up Instructions: Return if symptoms worsen or fail to improve.   Ortho Exam  Patient is alert, oriented, no adenopathy, well-dressed, normal affect, normal respiratory effort. Examination patient has an antalgic gait uses a cane.  Her knee is tender to palpation over the medial lateral joint line on the right.  There is no effusion no redness.  Collaterals and cruciates are stable.  There is crepitation with range of motion of her knee.  Imaging: No results found. No images are attached to the encounter.  Labs: No results found for: HGBA1C, ESRSEDRATE, CRP, LABURIC, REPTSTATUS, GRAMSTAIN, CULT, LABORGA  Orders:  Orders Placed This Encounter  Procedures  . Large Joint Inj   No orders of the defined types were placed in this encounter.    Procedures: Large Joint Inj: R knee on 03/09/2017 10:23 AM Indications: pain and diagnostic  evaluation Details: 22 G 1.5 in needle, anteromedial approach  Arthrogram: No  Medications: 5 mL lidocaine 1 %; 40 mg methylPREDNISolone acetate 40 MG/ML Outcome: tolerated well, no immediate complications Procedure, treatment alternatives, risks and benefits explained, specific risks discussed. Consent was given by the patient. Immediately prior to procedure a time out was called to verify the correct patient, procedure, equipment, support staff and site/side marked as required. Patient was prepped and draped in the usual sterile fashion.      Clinical Data: No additional findings.  ROS:  All other systems negative, except as noted in the HPI. Review of Systems  Objective: Vital Signs: There were no vitals taken for this visit.  Specialty Comments:  No specialty comments available.  PMFS History: Patient Active Problem List   Diagnosis Date Noted  . Hypertension 01/06/2011  . Hyperlipidemia 01/06/2011   Past Medical History:  Diagnosis Date  . DJD (degenerative joint disease)   . Hyperlipidemia   . Hypertension   . Obesity   . Palpitations     History reviewed. No pertinent family history.  History reviewed. No pertinent surgical history. Social History   Occupational History  . Not on file  Tobacco Use  . Smoking status: Never Smoker  . Smokeless tobacco: Never Used  Substance and Sexual Activity  . Alcohol use:  Yes    Comment: rare  . Drug use: No  . Sexual activity: Not on file

## 2017-04-14 DIAGNOSIS — R600 Localized edema: Secondary | ICD-10-CM | POA: Diagnosis not present

## 2017-04-14 DIAGNOSIS — R7989 Other specified abnormal findings of blood chemistry: Secondary | ICD-10-CM | POA: Diagnosis not present

## 2017-04-14 DIAGNOSIS — I10 Essential (primary) hypertension: Secondary | ICD-10-CM | POA: Diagnosis not present

## 2017-04-14 DIAGNOSIS — Z0000 Encounter for general adult medical examination without abnormal findings: Secondary | ICD-10-CM | POA: Diagnosis not present

## 2017-06-02 DIAGNOSIS — R600 Localized edema: Secondary | ICD-10-CM | POA: Diagnosis not present

## 2017-06-02 DIAGNOSIS — I10 Essential (primary) hypertension: Secondary | ICD-10-CM | POA: Diagnosis not present

## 2017-06-02 DIAGNOSIS — E7800 Pure hypercholesterolemia, unspecified: Secondary | ICD-10-CM | POA: Diagnosis not present

## 2017-06-02 DIAGNOSIS — I471 Supraventricular tachycardia: Secondary | ICD-10-CM | POA: Diagnosis not present

## 2017-06-24 ENCOUNTER — Telehealth (INDEPENDENT_AMBULATORY_CARE_PROVIDER_SITE_OTHER): Payer: Self-pay | Admitting: Orthopedic Surgery

## 2017-06-24 NOTE — Telephone Encounter (Signed)
faxed

## 2017-06-24 NOTE — Telephone Encounter (Signed)
Chinera from [place] called requesting the office notes from the patient's visit on March 09, 2017 sent to her attention at [phone].  Thank you.

## 2017-10-19 DIAGNOSIS — H524 Presbyopia: Secondary | ICD-10-CM | POA: Diagnosis not present

## 2018-04-07 DIAGNOSIS — R002 Palpitations: Secondary | ICD-10-CM | POA: Diagnosis not present

## 2018-04-07 DIAGNOSIS — I10 Essential (primary) hypertension: Secondary | ICD-10-CM | POA: Diagnosis not present

## 2018-06-06 ENCOUNTER — Encounter (HOSPITAL_BASED_OUTPATIENT_CLINIC_OR_DEPARTMENT_OTHER): Payer: Self-pay

## 2018-06-06 ENCOUNTER — Emergency Department (HOSPITAL_BASED_OUTPATIENT_CLINIC_OR_DEPARTMENT_OTHER): Payer: Medicare Other

## 2018-06-06 ENCOUNTER — Other Ambulatory Visit: Payer: Self-pay

## 2018-06-06 ENCOUNTER — Emergency Department (HOSPITAL_BASED_OUTPATIENT_CLINIC_OR_DEPARTMENT_OTHER)
Admission: EM | Admit: 2018-06-06 | Discharge: 2018-06-06 | Disposition: A | Discharge status: Home / Self Care | Payer: Medicare Other | Attending: Emergency Medicine | Admitting: Emergency Medicine

## 2018-06-06 DIAGNOSIS — Y929 Unspecified place or not applicable: Secondary | ICD-10-CM | POA: Diagnosis not present

## 2018-06-06 DIAGNOSIS — W19XXXA Unspecified fall, initial encounter: Secondary | ICD-10-CM | POA: Insufficient documentation

## 2018-06-06 DIAGNOSIS — M25551 Pain in right hip: Secondary | ICD-10-CM

## 2018-06-06 DIAGNOSIS — M79651 Pain in right thigh: Secondary | ICD-10-CM | POA: Diagnosis not present

## 2018-06-06 DIAGNOSIS — Z79899 Other long term (current) drug therapy: Secondary | ICD-10-CM | POA: Insufficient documentation

## 2018-06-06 DIAGNOSIS — S329XXA Fracture of unspecified parts of lumbosacral spine and pelvis, initial encounter for closed fracture: Secondary | ICD-10-CM | POA: Diagnosis not present

## 2018-06-06 DIAGNOSIS — Y9301 Activity, walking, marching and hiking: Secondary | ICD-10-CM | POA: Diagnosis not present

## 2018-06-06 DIAGNOSIS — Y999 Unspecified external cause status: Secondary | ICD-10-CM | POA: Diagnosis not present

## 2018-06-06 DIAGNOSIS — R262 Difficulty in walking, not elsewhere classified: Secondary | ICD-10-CM | POA: Diagnosis not present

## 2018-06-06 DIAGNOSIS — I10 Essential (primary) hypertension: Secondary | ICD-10-CM | POA: Diagnosis not present

## 2018-06-06 DIAGNOSIS — S79911A Unspecified injury of right hip, initial encounter: Secondary | ICD-10-CM | POA: Diagnosis not present

## 2018-06-06 DIAGNOSIS — Z7982 Long term (current) use of aspirin: Secondary | ICD-10-CM | POA: Insufficient documentation

## 2018-06-06 LAB — CBC WITH DIFFERENTIAL/PLATELET
ABS IMMATURE GRANULOCYTES: 0.01 10*3/uL (ref 0.00–0.07)
BASOS ABS: 0 10*3/uL (ref 0.0–0.1)
Basophils Relative: 1 %
Eosinophils Absolute: 0.1 10*3/uL (ref 0.0–0.5)
Eosinophils Relative: 2 %
HCT: 30.6 % — ABNORMAL LOW (ref 36.0–46.0)
HEMOGLOBIN: 9.6 g/dL — AB (ref 12.0–15.0)
IMMATURE GRANULOCYTES: 0 %
LYMPHS PCT: 22 %
Lymphs Abs: 1.3 10*3/uL (ref 0.7–4.0)
MCH: 29.4 pg (ref 26.0–34.0)
MCHC: 31.4 g/dL (ref 30.0–36.0)
MCV: 93.9 fL (ref 80.0–100.0)
MONO ABS: 0.5 10*3/uL (ref 0.1–1.0)
Monocytes Relative: 8 %
NEUTROS ABS: 4 10*3/uL (ref 1.7–7.7)
NEUTROS PCT: 67 %
Platelets: 175 10*3/uL (ref 150–400)
RBC: 3.26 MIL/uL — AB (ref 3.87–5.11)
RDW: 13.9 % (ref 11.5–15.5)
WBC: 5.8 10*3/uL (ref 4.0–10.5)
nRBC: 0 % (ref 0.0–0.2)

## 2018-06-06 LAB — BASIC METABOLIC PANEL
Anion gap: 5 (ref 5–15)
BUN: 17 mg/dL (ref 8–23)
CHLORIDE: 109 mmol/L (ref 98–111)
CO2: 27 mmol/L (ref 22–32)
Calcium: 8.8 mg/dL — ABNORMAL LOW (ref 8.9–10.3)
Creatinine, Ser: 0.76 mg/dL (ref 0.44–1.00)
GFR calc Af Amer: >60 mL/min (ref >60)
GFR calc non Af Amer: >60 mL/min (ref >60)
Glucose, Bld: 107 mg/dL — ABNORMAL HIGH (ref 70–99)
POTASSIUM: 3.1 mmol/L — AB (ref 3.5–5.1)
SODIUM: 141 mmol/L (ref 135–145)

## 2018-06-06 LAB — PROTIME-INR
INR: 0.97
PROTHROMBIN TIME: 12.8 s (ref 11.4–15.2)

## 2018-06-06 MED ORDER — ACETAMINOPHEN ER 650 MG PO TBCR: 650.0000 mg | EXTENDED_RELEASE_TABLET | Freq: Three times a day (TID) | ORAL | 0 refills | Status: AC

## 2018-06-06 MED ORDER — ACETAMINOPHEN 325 MG PO TABS
650.0000 mg | ORAL_TABLET | Freq: Once | ORAL | Status: AC
Start: 2018-06-06 — End: 2018-06-06
  Administered 2018-06-06: 650 mg via ORAL
  Filled 2018-06-06: qty 2

## 2018-06-06 MED ORDER — IBUPROFEN 400 MG PO TABS: 400.0000 mg | ORAL_TABLET | Freq: Three times a day (TID) | ORAL | 0 refills | Status: AC | PRN

## 2018-06-06 MED ORDER — NAPROXEN 250 MG PO TABS
250.0000 mg | ORAL_TABLET | Freq: Once | ORAL | Status: AC
  Administered 2018-06-06: 250 mg via ORAL
  Filled 2018-06-06: qty 1

## 2018-06-06 NOTE — ED Notes (Signed)
Pt unable to tolerate weight bareing to right leg. Reports pain is intolerable. At home pt has not been able to walk.

## 2018-06-06 NOTE — ED Triage Notes (Signed)
Last week, the patient fell in the parking lot due to rain/slippery conditions. Pt denies LOC. Fall was witnessed. Right sided hip/leg pain.

## 2018-06-06 NOTE — ED Notes (Signed)
Patient transported to CT 

## 2018-06-06 NOTE — ED Notes (Signed)
ED Provider at bedside. 

## 2018-06-06 NOTE — Discharge Instructions (Addendum)
We saw in the ER for difficulty in walking due to pain in your right hip.  The x-ray and CAT scan does not reveal any evidence of fracture. Unfortunately, you are still having difficulty walking and we are not 100% clear why you are having difficulty in walking.  You have chosen the option of going home and having home health assess you at your house.  This means that someone from social worker team will contact you tomorrow morning and send home health team to your house early next week. In the interim, it is very important that you take great care at not falling and hurting herself.  We also recommend that you apply heat or cold treatment. We would also like you to set up an appointment with your primary care doctor for further evaluation of this pain.  If at any point you feel like your pain is getting worse and you are feeling unsafe at home -please call 911 and come to a nearby emergency department.

## 2018-06-06 NOTE — ED Notes (Signed)
Patient transported to X-ray 

## 2018-06-06 NOTE — ED Provider Notes (Signed)
[facility] EMERGENCY DEPARTMENT Provider Note   CSN: [id number] Arrival date & time: 06/06/18  1315     History   Chief Complaint Chief Complaint  Patient presents with  . Fall    HPI Tara Price is a 85 y.o. female.  HPI 85 year old female brought into the ER with chief complaint of fall and leg pain.  Patient has history of degenerative disc disease, hypertension, hyperlipidemia and resides with her daughter without requiring significant help with ADLs.  According to the patient, patient had a fall about a week ago.  Subsequently patient had been walking up and down the house and able to take care of herself until 2 days ago when patient started complaining of pain in her right leg.  The pain is located over the right thigh and hip area.  Patient denies any subsequent falls or injuries.  She states that the pain is reproduced with any movement or activity.  Patient denies any associated nausea, vomiting, fevers, chills.  There is no history of similar symptoms in the past.  Past Medical History:  Diagnosis Date  . DJD (degenerative joint disease)   . Hyperlipidemia   . Hypertension   . Obesity   . Palpitations     Patient Active Problem List   Diagnosis Date Noted  . Hypertension 01/06/2011  . Hyperlipidemia 01/06/2011    Past Surgical History:  Procedure Laterality Date  . ABDOMINAL HYSTERECTOMY    . CHOLECYSTECTOMY    . ROTATOR CUFF REPAIR       OB History   No obstetric history on file.      Home Medications    Prior to Admission medications   Medication Sig Start Date End Date Taking? Authorizing Provider  calcium-vitamin D (OSCAL WITH D) 250-125 MG-UNIT tablet Take 1 tablet by mouth daily.   Yes [provider]  acetaminophen (TYLENOL 8 HOUR) 650 MG CR tablet Take 1 tablet (650 mg total) by mouth every 8 (eight) hours. 06/06/18   Varney Biles, MD  aspirin 325 MG tablet Take 325 mg by mouth daily.      [provider]    atorvastatin (LIPITOR) 10 MG tablet Take 1 tablet by mouth Daily. 12/17/10   [provider]  hydrochlorothiazide (MICROZIDE) 12.5 MG capsule daily. 06/22/13   [provider]  ibuprofen (ADVIL,MOTRIN) 400 MG tablet Take 1 tablet (400 mg total) by mouth every 8 (eight) hours as needed. 06/06/18   Varney Biles, MD  losartan (COZAAR) 50 MG tablet daily. 06/22/13   [provider]  metoprolol (TOPROL-XL) 100 MG 24 hr tablet Take 1 tablet by mouth Daily. 12/17/10   [provider]  predniSONE (DELTASONE) 10 MG tablet Take 1 tablet (10 mg total) daily with breakfast by mouth. 03/09/17   Newt Minion, MD    Family History No family history on file.  Social History Social History   Tobacco Use  . Smoking status: Never Smoker  . Smokeless tobacco: Never Used  Substance Use Topics  . Alcohol use: Yes    Comment: rare  . Drug use: No     Allergies   Patient has no known allergies.   Review of Systems Review of Systems  Constitutional: Positive for activity change.  Respiratory: Negative for shortness of breath.   Cardiovascular: Negative for chest pain.  Gastrointestinal: Negative for abdominal pain.  Genitourinary: Negative for dysuria.  Musculoskeletal: Positive for arthralgias and gait problem.  All other systems reviewed and are negative.  Physical Exam Updated Vital Signs BP (!) 180/66   Pulse 65   Temp 98.5 F (36.9 C)   Resp 18   SpO2 99%   Physical Exam Vitals signs and nursing note reviewed.  Constitutional:      Appearance: She is well-developed.  HENT:     Head: Normocephalic and atraumatic.  Neck:     Musculoskeletal: Normal range of motion and neck supple.  Cardiovascular:     Rate and Rhythm: Normal rate.  Pulmonary:     Effort: Pulmonary effort is normal.  Abdominal:     General: Bowel sounds are normal.     Tenderness: There is no abdominal tenderness.  Musculoskeletal:        General: Swelling present.      Comments: Patient has right thigh swelling without any erythema, calor. Patient is able to flex her hip, but she grimaces as she does that.  No tenderness over the posterior hip, but patient does have reproducible tenderness over the anterior hip and proximal third of the femur.  Skin:    General: Skin is warm and dry.  Neurological:     Mental Status: She is alert and oriented to person, place, and time.      ED Treatments / Results  Labs (all labs ordered are listed, but only abnormal results are displayed) Labs Reviewed  BASIC METABOLIC PANEL - Abnormal; Notable for the following components:      Result Value   Potassium 3.1 (*)    Glucose, Bld 107 (*)    Calcium 8.8 (*)    All other components within normal limits  CBC WITH DIFFERENTIAL/PLATELET - Abnormal; Notable for the following components:   RBC 3.26 (*)    Hemoglobin 9.6 (*)    HCT 30.6 (*)    All other components within normal limits  PROTIME-INR    EKG None  Radiology Ct Pelvis Wo Contrast  Result Date: 06/06/2018 CLINICAL DATA:  Pelvic fracture, fell last week and parking lot in [place] slippery conditions, witnessed fall, RIGHT hip and leg pain EXAM: CT PELVIS WITHOUT CONTRAST TECHNIQUE: Multidetector CT imaging of the pelvis was performed following the standard protocol without intravenous contrast. COMPARISON:  RIGHT hip radiographs of 06/06/2018 FINDINGS: Urinary Tract:  Distal ureters and bladder normal appearance Bowel: Mild sigmoid diverticulosis without evidence of diverticulitis. Stool in rectum. Questionable wall thickening of distal sigmoid colon and rectum versus artifact from underdistention. Remaining bowel loops unremarkable. Vascular/Lymphatic: Scattered pelvic phleboliths. Atherosclerotic calcifications of the distal aorta, iliac arteries and common femoral arteries. Reproductive: Uterus surgically absent with normal size of LEFT ovary and nonvisualization of RIGHT ovary Other:  No free air or free fluid.   No hernia. Musculoskeletal: Mild degenerative changes at the pubic symphysis. Degenerative changes of BILATERAL hip joints. SI joints preserved. Multilevel facet degenerative changes at lower lumbar spine. No acute fracture, dislocation or bone destruction. IMPRESSION: No acute pelvic or proximal RIGHT femoral osseous abnormalities. Osseous demineralization with degenerative changes of the hip joints and facet degenerative changes at lower lumbar spine. Sigmoid diverticulosis with questionable wall thickening of the distal sigmoid colon and rectum versus artifact from underdistention, subtle colitis not excluded. Remainder of exam unremarkable. Electronically Signed   By: Lavonia Dana M.D.   On: 06/06/2018 15:21   Dg Hip Unilat  With Pelvis 2-3 Views Right  Result Date: 06/06/2018 CLINICAL DATA:  85 year old female fell in parking lot 10 days ago with subsequent increasing hip pain with weight-bearing. EXAM: DG HIP (WITH OR WITHOUT  PELVIS) 2-3V RIGHT COMPARISON:  None. FINDINGS: Femoral heads are normally located. Hip joint spaces are normal for age. Bone mineralization is within normal limits for age. No pelvis fracture. The proximal left femur appears grossly intact. Intact proximal right femur. Negative lower abdominal and pelvic visceral IMPRESSION: No acute fracture or dislocation identified about the right hip or pelvis. Electronically Signed   By: Genevie Ann M.D.   On: 06/06/2018 14:15    Procedures Procedures (including critical care time)  Medications Ordered in ED Medications  acetaminophen (TYLENOL) tablet 650 mg (650 mg Oral Given 06/06/18 1601)  naproxen (NAPROSYN) tablet 250 mg (250 mg Oral Given 06/06/18 1601)     Initial Impression / Assessment and Plan / ED Course  I have reviewed the triage vital signs and the nursing notes.  Pertinent labs & imaging results that were available during my care of the patient were reviewed by me and considered in my medical decision making (see chart for  details).     85 year old woman brought into the ER with chief complaint of difficulty in walking.  She does not have any significant medical history and lives independently with her daughter, able to normally take care of her ADLs.  Seems like there was a fall last week, but patient recovered from it.  She has been walking well 2 days ago when she started having sudden onset right thigh pain.  Her pain is reproducible with movement and with palpation.  She denies any new injuries that might have caused her to have pain.  X-ray of the hip was normal.  We try to ambulate the patient, however she was unable to bear any weight, therefore we proceeded with CT pelvis.  The CT scan did not reveal any acute findings.  Patient reassessed after the CAT scan.  She had no tenderness over the distal leg, she has intact pulse and there is no signs of cellulitis.  We do not have any clinical concerns for septic arthritis based on history and fairly normal hip range of motion.  Given that patient is having difficulty ambulating, we informed her that it is not safe for her to go home.  We would recommend that she stay in the hospital and get social work to evaluate patient.  Also informed her that since med [facility] is not a primary hospital, she might need transfer to [facility] or [facility] emergency room for those services to assess her.  Patient was not quite happy with that choice.  We gave her the option of going home, with home health following up with her tomorrow.  Patient resides in a supportive system, with her daughter living with her 24/7.  The daughter states that for the last 2 days she has been helping her mother get onto wheelchair and helping her with bathroom.  They have bedside commode, wheelchair, walker and she is comfortable taking her home as long as there is a plan for someone to help her further.  With this assurance from the family I feel comfortable sending her home.  We  discussed the need for extreme caution and vigilance and preventing any falls.  I also advised family to call 911 if there is any concerns about patient's safety.  Final Clinical Impressions(s) / ED Diagnoses   Final diagnoses:  Right hip pain  Inability to ambulate due to hip    ED Discharge Orders         Ordered    ibuprofen (ADVIL,MOTRIN) 400  MG tablet  Every 8 hours PRN     06/06/18 1556    acetaminophen (TYLENOL 8 HOUR) 650 MG CR tablet  Every 8 hours     06/06/18 Hanson, Jessicaann Overbaugh, MD 06/06/18 1643

## 2018-06-30 DIAGNOSIS — I471 Supraventricular tachycardia: Secondary | ICD-10-CM | POA: Diagnosis not present

## 2018-06-30 DIAGNOSIS — H547 Unspecified visual loss: Secondary | ICD-10-CM | POA: Diagnosis not present

## 2018-06-30 DIAGNOSIS — I10 Essential (primary) hypertension: Secondary | ICD-10-CM | POA: Diagnosis not present

## 2018-06-30 DIAGNOSIS — E7800 Pure hypercholesterolemia, unspecified: Secondary | ICD-10-CM | POA: Diagnosis not present

## 2018-07-07 ENCOUNTER — Encounter: Payer: Self-pay | Admitting: Internal Medicine

## 2018-07-13 ENCOUNTER — Telehealth: Payer: Self-pay

## 2018-07-13 NOTE — Telephone Encounter (Signed)
Call placed to Pt.  Spoke with daughter per DPR.  Advised at this time-routine follow up appointments are being cancelled to reduce possible exposure for our patients.  Per daughter-Pt is not currently having any issues.  Pt's new PCP wanted Pt to follow up (routine) with Dr. Rayann Heman.  Advised appointment would be cancelled and Pt would be contacted to reschedule.  Advised to call office if any needs.

## 2018-07-14 ENCOUNTER — Ambulatory Visit: Payer: Medicare Other | Admitting: Internal Medicine

## 2018-09-01 DIAGNOSIS — I10 Essential (primary) hypertension: Secondary | ICD-10-CM | POA: Diagnosis not present

## 2018-09-01 DIAGNOSIS — N183 Chronic kidney disease, stage 3 (moderate): Secondary | ICD-10-CM | POA: Diagnosis not present

## 2018-09-01 DIAGNOSIS — E7800 Pure hypercholesterolemia, unspecified: Secondary | ICD-10-CM | POA: Diagnosis not present

## 2018-12-06 DIAGNOSIS — H1850 Unspecified hereditary corneal dystrophies: Secondary | ICD-10-CM | POA: Diagnosis not present

## 2018-12-06 DIAGNOSIS — H1045 Other chronic allergic conjunctivitis: Secondary | ICD-10-CM | POA: Diagnosis not present

## 2018-12-06 DIAGNOSIS — H02831 Dermatochalasis of right upper eyelid: Secondary | ICD-10-CM | POA: Diagnosis not present

## 2018-12-06 DIAGNOSIS — H353130 Nonexudative age-related macular degeneration, bilateral, stage unspecified: Secondary | ICD-10-CM | POA: Diagnosis not present

## 2019-01-06 DIAGNOSIS — Z23 Encounter for immunization: Secondary | ICD-10-CM | POA: Diagnosis not present

## 2019-03-11 DIAGNOSIS — Z0000 Encounter for general adult medical examination without abnormal findings: Secondary | ICD-10-CM | POA: Diagnosis not present

## 2019-03-11 DIAGNOSIS — E7800 Pure hypercholesterolemia, unspecified: Secondary | ICD-10-CM | POA: Diagnosis not present

## 2019-03-11 DIAGNOSIS — I10 Essential (primary) hypertension: Secondary | ICD-10-CM | POA: Diagnosis not present

## 2019-03-11 DIAGNOSIS — I471 Supraventricular tachycardia: Secondary | ICD-10-CM | POA: Diagnosis not present

## 2019-03-16 DIAGNOSIS — Z0000 Encounter for general adult medical examination without abnormal findings: Secondary | ICD-10-CM | POA: Diagnosis not present

## 2019-03-16 DIAGNOSIS — I10 Essential (primary) hypertension: Secondary | ICD-10-CM | POA: Diagnosis not present

## 2019-03-16 DIAGNOSIS — E7800 Pure hypercholesterolemia, unspecified: Secondary | ICD-10-CM | POA: Diagnosis not present

## 2019-03-16 DIAGNOSIS — I471 Supraventricular tachycardia: Secondary | ICD-10-CM | POA: Diagnosis not present

## 2019-04-05 ENCOUNTER — Other Ambulatory Visit: Payer: Self-pay

## 2019-04-05 DIAGNOSIS — Z20822 Contact with and (suspected) exposure to covid-19: Secondary | ICD-10-CM

## 2019-04-07 LAB — NOVEL CORONAVIRUS, NAA: SARS-CoV-2, NAA: NOT DETECTED

## 2019-04-18 ENCOUNTER — Ambulatory Visit: Payer: Medicare Other | Attending: Internal Medicine

## 2019-04-18 DIAGNOSIS — Z20822 Contact with and (suspected) exposure to covid-19: Secondary | ICD-10-CM

## 2019-04-18 DIAGNOSIS — Z20828 Contact with and (suspected) exposure to other viral communicable diseases: Secondary | ICD-10-CM | POA: Diagnosis not present

## 2019-04-19 LAB — NOVEL CORONAVIRUS, NAA: SARS-CoV-2, NAA: NOT DETECTED

## 2019-10-26 IMAGING — CT CT PELVIS W/O CM
2 of 3 series · 16 of 46 positions shown, 18 images · non-contrast
Comparison: RIGHT hip radiographs of 06/06/2018

CLINICAL DATA: Pelvic fracture, fell last week and parking lot in
[place] slippery conditions, witnessed fall, RIGHT hip and leg pain

EXAM:
CT PELVIS WITHOUT CONTRAST
TECHNIQUE: Multidetector CT imaging of the pelvis was performed following the
standard protocol without intravenous contrast.

[Series 5: axial soft tissue · axial · 0.89mm/px · z∈[-429,-183]mm · 13 of 143 slices shown, 15 images]
[im 10/143  soft-tissue]
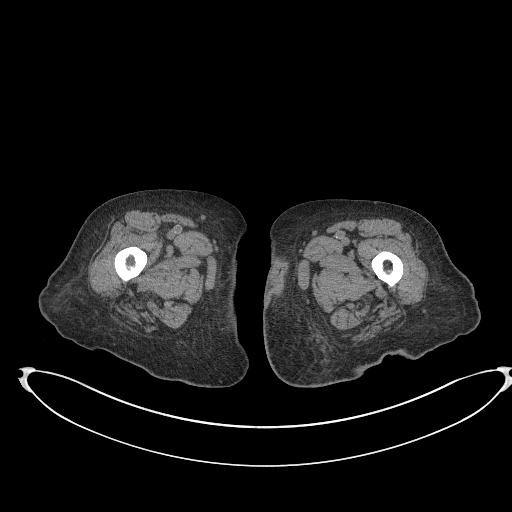
[im 10/143  bone]
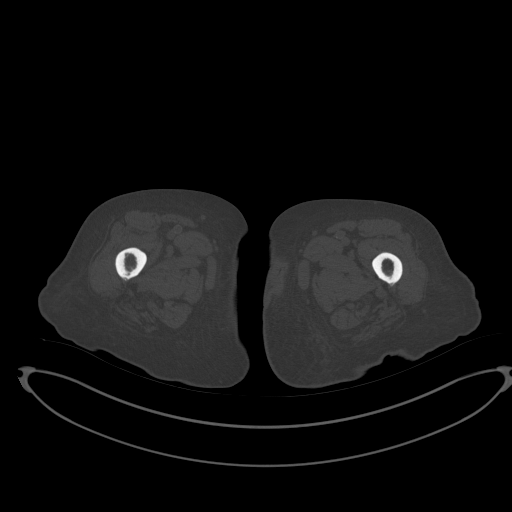
[im 19/143  soft-tissue]
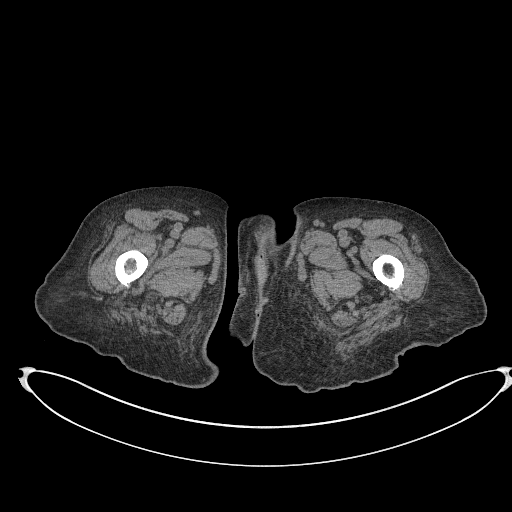
[im 28/143  soft-tissue]
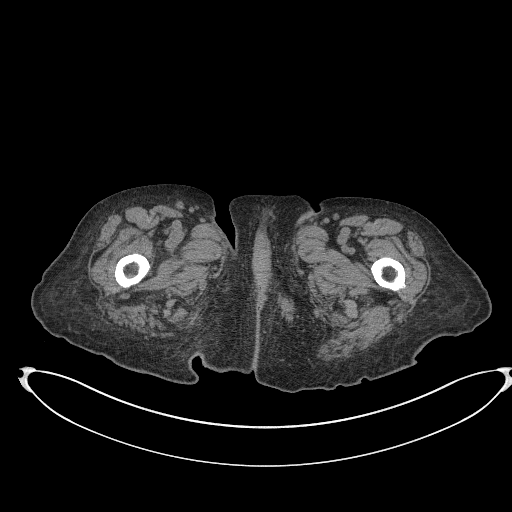
[im 42/143  soft-tissue]
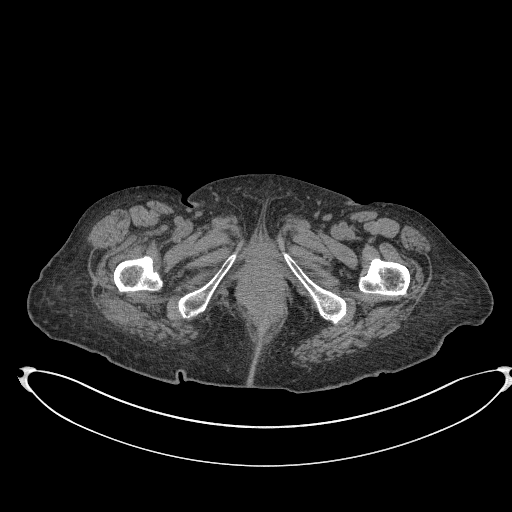
[im 51/143  soft-tissue]
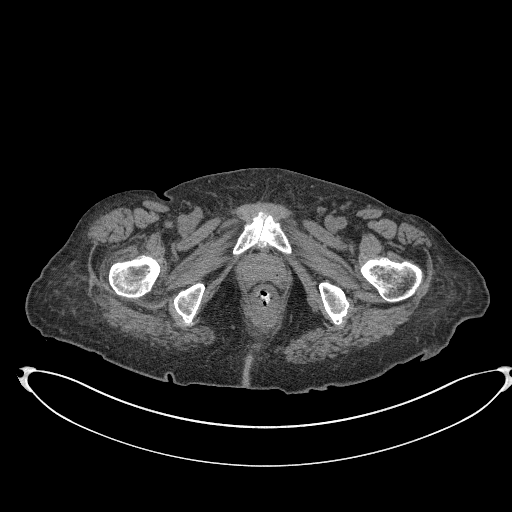
[im 60/143  soft-tissue]
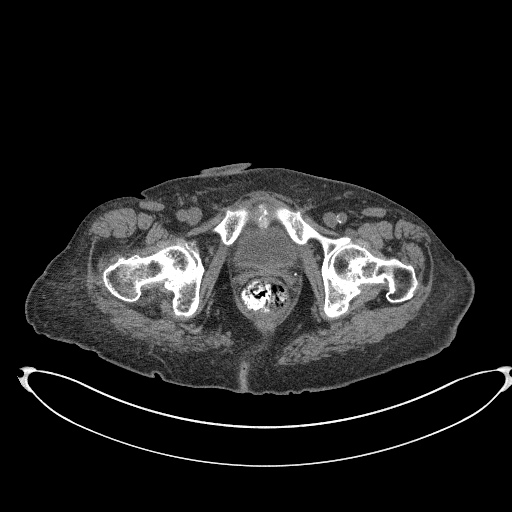
[im 74/143  soft-tissue]
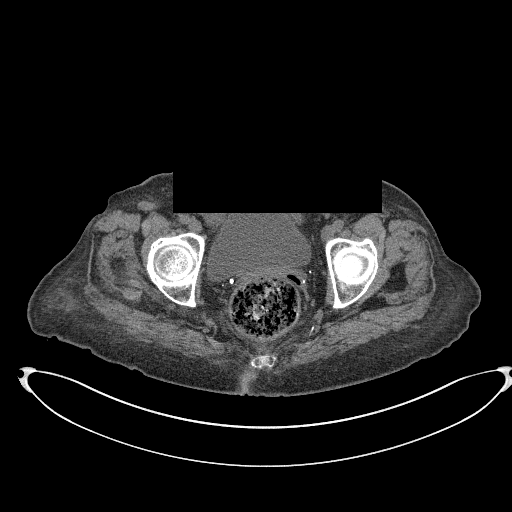
[im 83/143  soft-tissue]
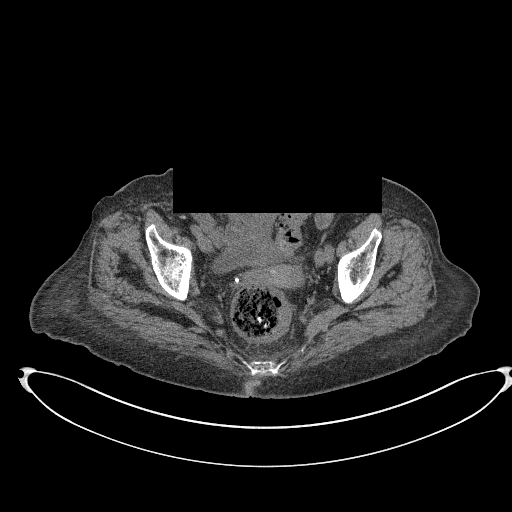
[im 92/143  soft-tissue]
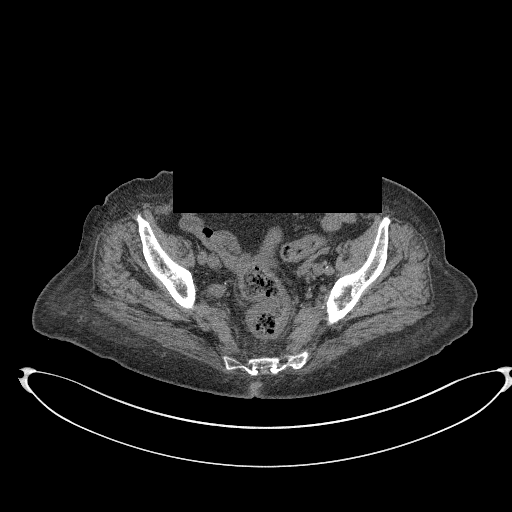
[im 92/143  bone]
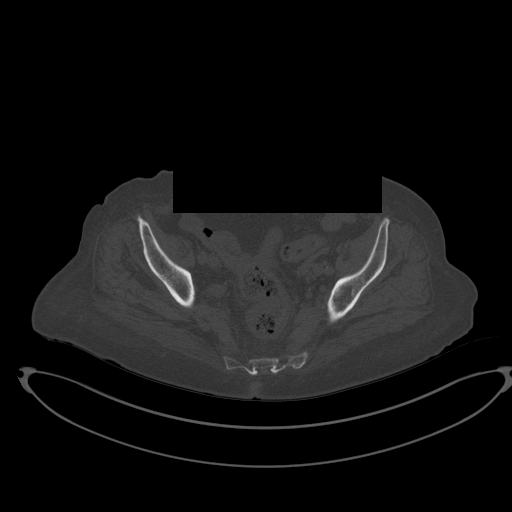
[im 101/143  soft-tissue]
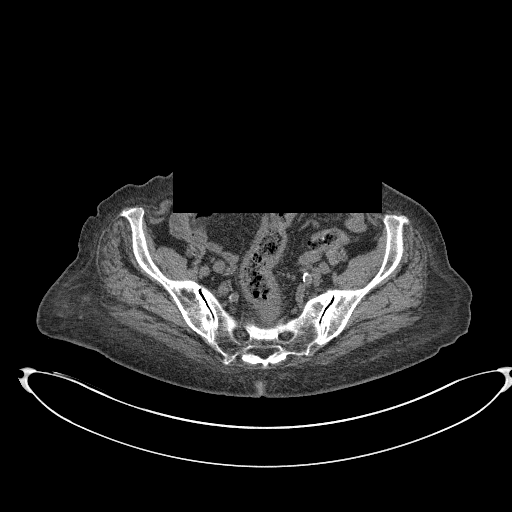
[im 115/143  soft-tissue]
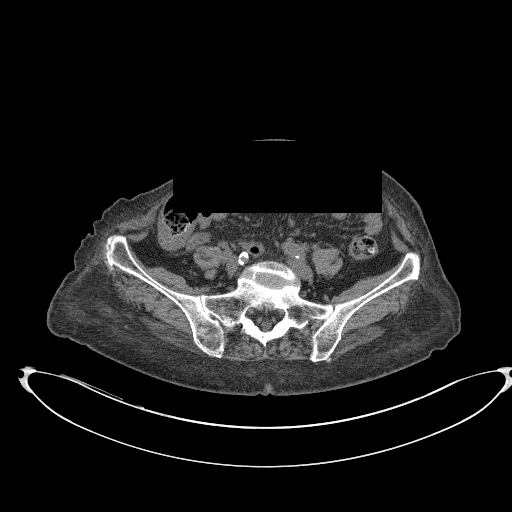
[im 124/143  soft-tissue]
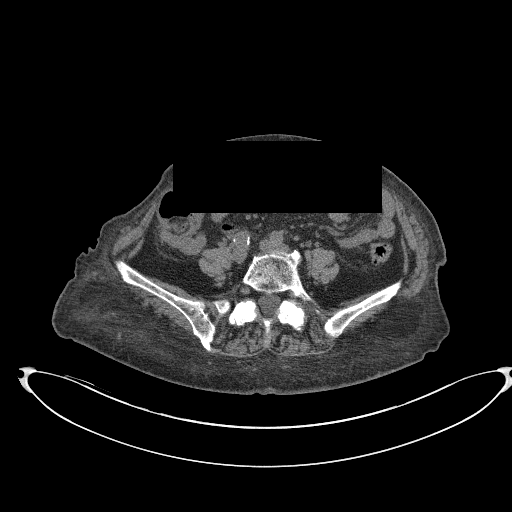
[im 133/143  soft-tissue]
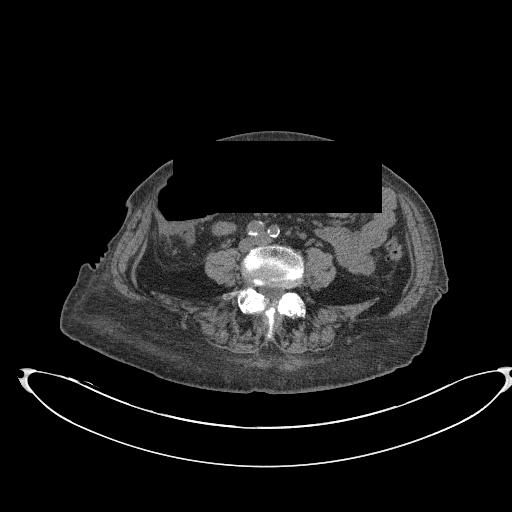

[Series 7: coronal st · coronal · 0.57mm/px · 3 of 85 slices shown]
[im 29/85  soft-tissue]
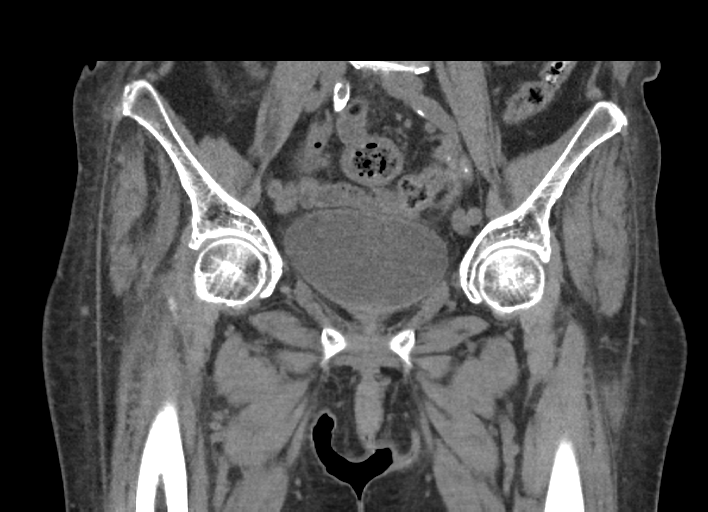
[im 38/85  soft-tissue]
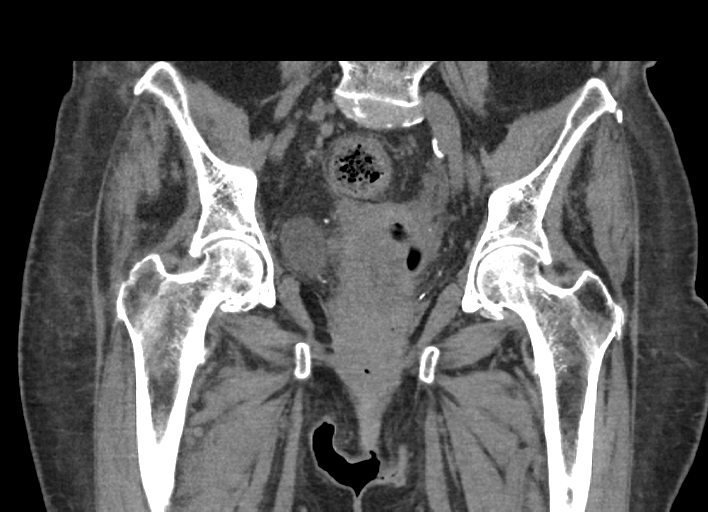
[im 47/85  soft-tissue]
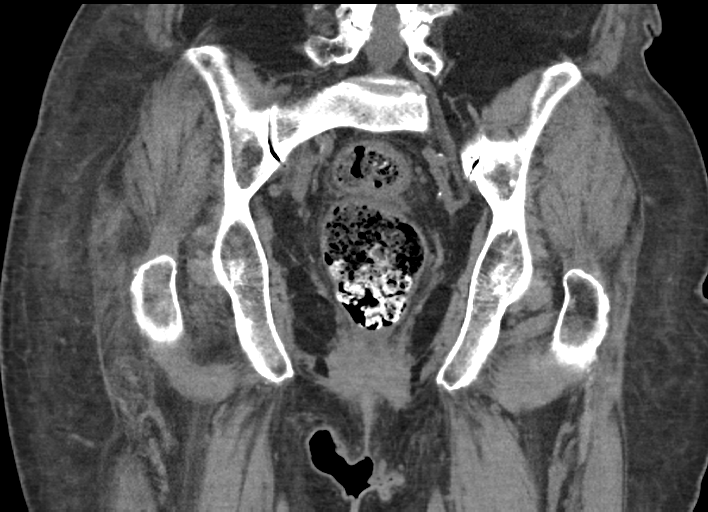

[16 of 46 positions shown; findings below may reference images not displayed]

FINDINGS: Urinary Tract:  Distal ureters and bladder normal appearance

Bowel: Mild sigmoid diverticulosis without evidence of
diverticulitis. Stool in rectum. Questionable wall thickening of
distal sigmoid colon and rectum versus artifact from
underdistention. Remaining bowel loops unremarkable.

Vascular/Lymphatic: Scattered pelvic phleboliths. Atherosclerotic
calcifications of the distal aorta, iliac arteries and common
femoral arteries.

Reproductive: Uterus surgically absent with normal size of LEFT
ovary and nonvisualization of RIGHT ovary

Other:  No free air or free fluid.  No hernia.

Musculoskeletal: Mild degenerative changes at the pubic symphysis.
Degenerative changes of BILATERAL hip joints. SI joints preserved.
Multilevel facet degenerative changes at lower lumbar spine. No
acute fracture, dislocation or bone destruction.
IMPRESSION: No acute pelvic or proximal RIGHT femoral osseous abnormalities.

Osseous demineralization with degenerative changes of the hip joints
and facet degenerative changes at lower lumbar spine.

Sigmoid diverticulosis with questionable wall thickening of the
distal sigmoid colon and rectum versus artifact from
underdistention, subtle colitis not excluded.

Remainder of exam unremarkable.

## 2019-12-08 DIAGNOSIS — H353131 Nonexudative age-related macular degeneration, bilateral, early dry stage: Secondary | ICD-10-CM | POA: Diagnosis not present

## 2019-12-08 DIAGNOSIS — H02831 Dermatochalasis of right upper eyelid: Secondary | ICD-10-CM | POA: Diagnosis not present

## 2019-12-08 DIAGNOSIS — H1045 Other chronic allergic conjunctivitis: Secondary | ICD-10-CM | POA: Diagnosis not present

## 2019-12-08 DIAGNOSIS — H18509 Unspecified hereditary corneal dystrophies, unspecified eye: Secondary | ICD-10-CM | POA: Diagnosis not present

## 2020-02-25 ENCOUNTER — Ambulatory Visit: Payer: Medicare Other | Attending: Internal Medicine

## 2020-02-25 DIAGNOSIS — Z23 Encounter for immunization: Secondary | ICD-10-CM

## 2020-02-25 NOTE — Progress Notes (Signed)
   Covid-19 Vaccination Clinic  Name:  ELIANNA WINDOM    MRN: [id number] DOB: [birth year]  02/25/2020  Ms. Kreft was observed post Covid-19 immunization for 15 minutes without incident. She was provided with Vaccine Information Sheet and instruction to access the V-Safe system.   Ms. Pion was instructed to call 911 with any severe reactions post vaccine: Marland Kitchen Difficulty breathing  . Swelling of face and throat  . A fast heartbeat  . A bad rash all over body  . Dizziness and weakness

## 2020-03-12 DIAGNOSIS — Z0000 Encounter for general adult medical examination without abnormal findings: Secondary | ICD-10-CM | POA: Diagnosis not present

## 2020-03-12 DIAGNOSIS — E7800 Pure hypercholesterolemia, unspecified: Secondary | ICD-10-CM | POA: Diagnosis not present

## 2020-03-12 DIAGNOSIS — I10 Essential (primary) hypertension: Secondary | ICD-10-CM | POA: Diagnosis not present

## 2020-09-11 DIAGNOSIS — E7800 Pure hypercholesterolemia, unspecified: Secondary | ICD-10-CM | POA: Diagnosis not present

## 2020-09-11 DIAGNOSIS — D649 Anemia, unspecified: Secondary | ICD-10-CM | POA: Diagnosis not present

## 2020-09-11 DIAGNOSIS — I471 Supraventricular tachycardia: Secondary | ICD-10-CM | POA: Diagnosis not present

## 2020-09-11 DIAGNOSIS — I10 Essential (primary) hypertension: Secondary | ICD-10-CM | POA: Diagnosis not present

## 2020-12-11 DIAGNOSIS — H18509 Unspecified hereditary corneal dystrophies, unspecified eye: Secondary | ICD-10-CM | POA: Diagnosis not present

## 2020-12-11 DIAGNOSIS — H353131 Nonexudative age-related macular degeneration, bilateral, early dry stage: Secondary | ICD-10-CM | POA: Diagnosis not present

## 2020-12-11 DIAGNOSIS — H1045 Other chronic allergic conjunctivitis: Secondary | ICD-10-CM | POA: Diagnosis not present

## 2020-12-11 DIAGNOSIS — H02831 Dermatochalasis of right upper eyelid: Secondary | ICD-10-CM | POA: Diagnosis not present

## 2021-02-27 DIAGNOSIS — N183 Chronic kidney disease, stage 3 unspecified: Secondary | ICD-10-CM | POA: Diagnosis not present

## 2021-02-27 DIAGNOSIS — I10 Essential (primary) hypertension: Secondary | ICD-10-CM | POA: Diagnosis not present

## 2021-02-27 DIAGNOSIS — D649 Anemia, unspecified: Secondary | ICD-10-CM | POA: Diagnosis not present

## 2021-02-27 DIAGNOSIS — E785 Hyperlipidemia, unspecified: Secondary | ICD-10-CM | POA: Diagnosis not present

## 2021-08-27 DIAGNOSIS — D649 Anemia, unspecified: Secondary | ICD-10-CM | POA: Diagnosis not present

## 2021-08-27 DIAGNOSIS — I10 Essential (primary) hypertension: Secondary | ICD-10-CM | POA: Diagnosis not present

## 2021-08-27 DIAGNOSIS — E7800 Pure hypercholesterolemia, unspecified: Secondary | ICD-10-CM | POA: Diagnosis not present

## 2021-08-27 DIAGNOSIS — Z23 Encounter for immunization: Secondary | ICD-10-CM | POA: Diagnosis not present

## 2022-01-29 DIAGNOSIS — H353131 Nonexudative age-related macular degeneration, bilateral, early dry stage: Secondary | ICD-10-CM | POA: Diagnosis not present

## 2022-01-29 DIAGNOSIS — H1045 Other chronic allergic conjunctivitis: Secondary | ICD-10-CM | POA: Diagnosis not present

## 2022-01-29 DIAGNOSIS — H18509 Unspecified hereditary corneal dystrophies, unspecified eye: Secondary | ICD-10-CM | POA: Diagnosis not present

## 2022-01-29 DIAGNOSIS — H02831 Dermatochalasis of right upper eyelid: Secondary | ICD-10-CM | POA: Diagnosis not present

## 2022-03-14 DIAGNOSIS — Z6825 Body mass index (BMI) 25.0-25.9, adult: Secondary | ICD-10-CM | POA: Diagnosis not present

## 2022-03-14 DIAGNOSIS — I10 Essential (primary) hypertension: Secondary | ICD-10-CM | POA: Diagnosis not present

## 2022-03-14 DIAGNOSIS — E7800 Pure hypercholesterolemia, unspecified: Secondary | ICD-10-CM | POA: Diagnosis not present

## 2022-03-23 ENCOUNTER — Emergency Department (HOSPITAL_BASED_OUTPATIENT_CLINIC_OR_DEPARTMENT_OTHER)
Admission: EM | Admit: 2022-03-23 | Discharge: 2022-03-23 | Disposition: A | Discharge status: Home / Self Care | Payer: Medicare Other | Attending: Emergency Medicine | Admitting: Emergency Medicine

## 2022-03-23 ENCOUNTER — Emergency Department (HOSPITAL_BASED_OUTPATIENT_CLINIC_OR_DEPARTMENT_OTHER): Payer: Medicare Other

## 2022-03-23 DIAGNOSIS — Z79899 Other long term (current) drug therapy: Secondary | ICD-10-CM | POA: Diagnosis not present

## 2022-03-23 DIAGNOSIS — Z7982 Long term (current) use of aspirin: Secondary | ICD-10-CM | POA: Insufficient documentation

## 2022-03-23 DIAGNOSIS — Y9222 Religious institution as the place of occurrence of the external cause: Secondary | ICD-10-CM | POA: Diagnosis not present

## 2022-03-23 DIAGNOSIS — W19XXXA Unspecified fall, initial encounter: Secondary | ICD-10-CM

## 2022-03-23 DIAGNOSIS — S0101XA Laceration without foreign body of scalp, initial encounter: Secondary | ICD-10-CM | POA: Insufficient documentation

## 2022-03-23 DIAGNOSIS — W010XXA Fall on same level from slipping, tripping and stumbling without subsequent striking against object, initial encounter: Secondary | ICD-10-CM | POA: Insufficient documentation

## 2022-03-23 DIAGNOSIS — S0191XA Laceration without foreign body of unspecified part of head, initial encounter: Secondary | ICD-10-CM | POA: Diagnosis not present

## 2022-03-23 DIAGNOSIS — S0990XA Unspecified injury of head, initial encounter: Secondary | ICD-10-CM | POA: Diagnosis not present

## 2022-03-23 NOTE — Discharge Instructions (Signed)
You were seen in the emergency department for evaluation of injuries from a fall.  You had a CAT scan of your head that did not show any significant abnormalities.  You do have a small laceration there that does not require any sutures or staples.  Please use Tylenol as needed for pain, ice to the affected area.  If rebleeds hold gentle pressure.  Return to the emergency department if any worsening or concerning symptoms

## 2022-03-23 NOTE — ED Triage Notes (Signed)
Pt here from home with a lac to her head from a fall at church this [place] , no loc , bleeding stopped on arrival to ED

## 2022-03-23 NOTE — ED Provider Notes (Signed)
[facility] EMERGENCY DEPT Provider Note   CSN: [id number] Arrival date & time: 03/23/22  1501     History  Chief Complaint  Patient presents with   Tara Price is a 88 y.o. female.  She is here for evaluation of fall and head injury after she tripped while in church today.  She hit her head and had some bleeding.  There was no loss of consciousness.  She denies any other injuries or complaints.  Her family found out and demanded that she come here for further evaluation.  She denies any other complaints.  The history is provided by the patient.  Fall This is a new problem. The current episode started 6 to 12 hours ago. The problem has been resolved. Associated symptoms include headaches. Pertinent negatives include no chest pain, no abdominal pain and no shortness of breath. Nothing aggravates the symptoms. Nothing relieves the symptoms. She has tried rest for the symptoms. The treatment provided moderate relief.       Home Medications Prior to Admission medications   Medication Sig Start Date End Date Taking? Authorizing Provider  acetaminophen (TYLENOL 8 HOUR) 650 MG CR tablet Take 1 tablet (650 mg total) by mouth every 8 (eight) hours. 06/06/18   Varney Biles, MD  aspirin 325 MG tablet Take 325 mg by mouth daily.      [provider]  atorvastatin (LIPITOR) 10 MG tablet Take 1 tablet by mouth Daily. 12/17/10   [provider]  calcium-vitamin D (OSCAL WITH D) 250-125 MG-UNIT tablet Take 1 tablet by mouth daily.    [provider]  hydrochlorothiazide (MICROZIDE) 12.5 MG capsule daily. 06/22/13   [provider]  ibuprofen (ADVIL,MOTRIN) 400 MG tablet Take 1 tablet (400 mg total) by mouth every 8 (eight) hours as needed. 06/06/18   Varney Biles, MD  losartan (COZAAR) 50 MG tablet daily. 06/22/13   [provider]  metoprolol (TOPROL-XL) 100 MG 24 hr tablet Take 1 tablet by mouth Daily. 12/17/10   [provider]  predniSONE (DELTASONE) 10 MG tablet Take 1 tablet (10 mg total) daily with breakfast by mouth. 03/09/17   Newt Minion, MD      Allergies    Ibuprofen    Review of Systems   Review of Systems  Constitutional:  Negative for fever.  HENT:  Negative for sore throat.   Eyes:  Negative for visual disturbance.  Respiratory:  Negative for shortness of breath.   Cardiovascular:  Negative for chest pain.  Gastrointestinal:  Negative for abdominal pain.  Genitourinary:  Negative for dysuria.  Skin:  Positive for wound. Negative for rash.  Neurological:  Positive for headaches.    Physical Exam Updated Vital Signs BP (!) 192/57 (BP Location: Right Arm)   Pulse 75   Temp 98.5 F (36.9 C) (Oral)   Resp 18   SpO2 100%  Physical Exam Vitals and nursing note reviewed.  Constitutional:      General: She is not in acute distress.    Appearance: Normal appearance. She is well-developed.  HENT:     Head: Normocephalic.     Comments: She has a hematoma on her left parietal area and approximately 1 cm laceration that is not bleeding. Eyes:     Conjunctiva/sclera: Conjunctivae normal.  Cardiovascular:     Rate and Rhythm: Normal rate and regular rhythm.     Heart sounds: No murmur heard. Pulmonary:     Effort: Pulmonary effort is  normal. No respiratory distress.     Breath sounds: Normal breath sounds.  Abdominal:     Palpations: Abdomen is soft.     Tenderness: There is no abdominal tenderness.  Musculoskeletal:        General: No swelling.     Cervical back: Neck supple.  Skin:    General: Skin is warm and dry.     Capillary Refill: Capillary refill takes less than 2 seconds.  Neurological:     General: No focal deficit present.     Mental Status: She is alert.     ED Results / Procedures / Treatments   Labs (all labs ordered are listed, but only abnormal results are displayed) Labs Reviewed - No data to display  EKG None  Radiology CT Head Wo  Contrast  Result Date: 03/23/2022 CLINICAL DATA:  Head laceration EXAM: CT HEAD WITHOUT CONTRAST TECHNIQUE: Contiguous axial images were obtained from the base of the skull through the vertex without intravenous contrast. RADIATION DOSE REDUCTION: This exam was performed according to the departmental dose-optimization program which includes automated exposure control, adjustment of the mA and/or kV according to patient size and/or use of iterative reconstruction technique. COMPARISON:  11/26/2007 FINDINGS: Brain: No evidence of acute infarction, hemorrhage, hydrocephalus, extra-axial collection or mass lesion/mass effect. Periventricular and deep white matter hypodensity. Vascular: No hyperdense vessel or unexpected calcification. Skull: Normal. Negative for fracture or focal lesion. Sinuses/Orbits: No acute finding. Other: None. IMPRESSION: No acute intracranial pathology. Small-vessel white matter disease. Electronically Signed   By: Delanna Ahmadi M.D.   On: 03/23/2022 16:08    Procedures Procedures    Medications Ordered in ED Medications - No data to display  ED Course/ Medical Decision Making/ A&P                           Medical Decision Making Amount and/or Complexity of Data Reviewed Radiology: ordered.   This patient complains of fall with head injury; this involves an extensive number of treatment Options and is a complaint that carries with it a high risk of complications and morbidity. The differential includes contusion, fracture, bleed, stroke I ordered imaging studies which included CT head and I independently    visualized and interpreted imaging which showed soft tissue swelling no acute findings otherwise Additional history obtained from patient's companion Previous records obtained and reviewed in epic no recent admissions Social determinants considered, no significant barriers Critical Interventions: None  After the interventions stated above, I reevaluated the  patient and found patient to be well-appearing in no distress Admission and further testing considered, no indications for admission or further work-up at this time.  Discussed symptomatic treatment and return instructions reviewed         Final Clinical Impression(s) / ED Diagnoses Final diagnoses:  Fall, initial encounter  Traumatic injury of head, initial encounter  Scalp laceration, initial encounter    Rx / DC Orders ED Discharge Orders     None         Hayden Rasmussen, MD 03/24/22 1013

## 2022-09-11 DIAGNOSIS — E7800 Pure hypercholesterolemia, unspecified: Secondary | ICD-10-CM | POA: Diagnosis not present

## 2022-09-11 DIAGNOSIS — I471 Supraventricular tachycardia, unspecified: Secondary | ICD-10-CM | POA: Diagnosis not present

## 2022-09-11 DIAGNOSIS — Z0000 Encounter for general adult medical examination without abnormal findings: Secondary | ICD-10-CM | POA: Diagnosis not present

## 2022-09-11 DIAGNOSIS — I10 Essential (primary) hypertension: Secondary | ICD-10-CM | POA: Diagnosis not present

## 2022-09-11 DIAGNOSIS — D649 Anemia, unspecified: Secondary | ICD-10-CM | POA: Diagnosis not present

## 2023-02-03 DIAGNOSIS — H524 Presbyopia: Secondary | ICD-10-CM | POA: Diagnosis not present

## 2023-02-03 DIAGNOSIS — H18509 Unspecified hereditary corneal dystrophies, unspecified eye: Secondary | ICD-10-CM | POA: Diagnosis not present

## 2023-02-03 DIAGNOSIS — H04123 Dry eye syndrome of bilateral lacrimal glands: Secondary | ICD-10-CM | POA: Diagnosis not present

## 2023-02-03 DIAGNOSIS — H02831 Dermatochalasis of right upper eyelid: Secondary | ICD-10-CM | POA: Diagnosis not present

## 2023-02-03 DIAGNOSIS — H02834 Dermatochalasis of left upper eyelid: Secondary | ICD-10-CM | POA: Diagnosis not present

## 2023-03-03 DIAGNOSIS — I10 Essential (primary) hypertension: Secondary | ICD-10-CM | POA: Diagnosis not present

## 2023-03-03 DIAGNOSIS — D649 Anemia, unspecified: Secondary | ICD-10-CM | POA: Diagnosis not present

## 2023-03-03 DIAGNOSIS — E7800 Pure hypercholesterolemia, unspecified: Secondary | ICD-10-CM | POA: Diagnosis not present

## 2023-03-03 DIAGNOSIS — D696 Thrombocytopenia, unspecified: Secondary | ICD-10-CM | POA: Diagnosis not present

## 2023-03-13 DIAGNOSIS — I10 Essential (primary) hypertension: Secondary | ICD-10-CM | POA: Diagnosis not present

## 2023-10-06 DIAGNOSIS — R600 Localized edema: Secondary | ICD-10-CM | POA: Diagnosis not present

## 2023-10-06 DIAGNOSIS — D649 Anemia, unspecified: Secondary | ICD-10-CM | POA: Diagnosis not present

## 2023-10-06 DIAGNOSIS — E7800 Pure hypercholesterolemia, unspecified: Secondary | ICD-10-CM | POA: Diagnosis not present

## 2023-10-06 DIAGNOSIS — Z23 Encounter for immunization: Secondary | ICD-10-CM | POA: Diagnosis not present

## 2023-10-06 DIAGNOSIS — Z0000 Encounter for general adult medical examination without abnormal findings: Secondary | ICD-10-CM | POA: Diagnosis not present

## 2023-10-06 DIAGNOSIS — I10 Essential (primary) hypertension: Secondary | ICD-10-CM | POA: Diagnosis not present

## 2024-02-09 DIAGNOSIS — Z961 Presence of intraocular lens: Secondary | ICD-10-CM | POA: Diagnosis not present

## 2024-02-09 DIAGNOSIS — H02831 Dermatochalasis of right upper eyelid: Secondary | ICD-10-CM | POA: Diagnosis not present

## 2024-02-09 DIAGNOSIS — H02834 Dermatochalasis of left upper eyelid: Secondary | ICD-10-CM | POA: Diagnosis not present

## 2024-02-09 DIAGNOSIS — H04123 Dry eye syndrome of bilateral lacrimal glands: Secondary | ICD-10-CM | POA: Diagnosis not present

## 2024-02-25 DIAGNOSIS — Z6823 Body mass index (BMI) 23.0-23.9, adult: Secondary | ICD-10-CM | POA: Diagnosis not present

## 2024-02-25 DIAGNOSIS — R519 Headache, unspecified: Secondary | ICD-10-CM | POA: Diagnosis not present

## 2024-04-06 DIAGNOSIS — I10 Essential (primary) hypertension: Secondary | ICD-10-CM | POA: Diagnosis not present
